# Patient Record
Sex: Male | Born: 1980 | Race: White | Hispanic: No | Marital: Single | Smoking: Current every day smoker
Health system: Southern US, Community
[De-identification: ages and names within clinical notes are randomized; demographics above are authoritative.]

## PROBLEM LIST (undated history)

## (undated) HISTORY — PX: HERNIA REPAIR: SHX51

---

## 2010-03-21 ENCOUNTER — Emergency Department: Payer: Self-pay | Admitting: Emergency Medicine

## 2011-07-01 ENCOUNTER — Emergency Department: Payer: Self-pay | Admitting: Unknown Physician Specialty

## 2011-12-18 ENCOUNTER — Emergency Department: Payer: Self-pay | Admitting: Emergency Medicine

## 2011-12-18 LAB — CBC
HCT: 52.2 % — ABNORMAL HIGH (ref 40.0–52.0)
MCHC: 34.1 g/dL (ref 32.0–36.0)
MCV: 89 fL (ref 80–100)
Platelet: 223 10*3/uL (ref 150–440)
RBC: 5.89 10*6/uL (ref 4.40–5.90)
RDW: 13.3 % (ref 11.5–14.5)

## 2011-12-18 LAB — URINALYSIS, COMPLETE
Bilirubin,UR: NEGATIVE
Blood: NEGATIVE
Glucose,UR: NEGATIVE mg/dL (ref 0–75)
Leukocyte Esterase: NEGATIVE
Ph: 7 (ref 4.5–8.0)
Protein: NEGATIVE
RBC,UR: NONE SEEN /HPF (ref 0–5)
Specific Gravity: 1.003 (ref 1.003–1.030)

## 2011-12-18 LAB — COMPREHENSIVE METABOLIC PANEL
Albumin: 4 g/dL (ref 3.4–5.0)
Anion Gap: 11 (ref 7–16)
BUN: 10 mg/dL (ref 7–18)
Calcium, Total: 8.8 mg/dL (ref 8.5–10.1)
Chloride: 109 mmol/L — ABNORMAL HIGH (ref 98–107)
Co2: 23 mmol/L (ref 21–32)
Creatinine: 0.85 mg/dL (ref 0.60–1.30)
Glucose: 85 mg/dL (ref 65–99)
Osmolality: 283 (ref 275–301)
SGOT(AST): 27 U/L (ref 15–37)
SGPT (ALT): 47 U/L
Total Protein: 7.2 g/dL (ref 6.4–8.2)

## 2014-10-30 ENCOUNTER — Emergency Department: Payer: Self-pay | Admitting: Emergency Medicine

## 2015-04-11 ENCOUNTER — Emergency Department
Admission: EM | Admit: 2015-04-11 | Discharge: 2015-04-11 | Disposition: A | Discharge status: Home / Self Care | Payer: Self-pay | Attending: Emergency Medicine | Admitting: Emergency Medicine

## 2015-04-11 DIAGNOSIS — L02415 Cutaneous abscess of right lower limb: Secondary | ICD-10-CM | POA: Insufficient documentation

## 2015-04-11 DIAGNOSIS — L03115 Cellulitis of right lower limb: Secondary | ICD-10-CM | POA: Insufficient documentation

## 2015-04-11 DIAGNOSIS — L03119 Cellulitis of unspecified part of limb: Secondary | ICD-10-CM

## 2015-04-11 DIAGNOSIS — Z720 Tobacco use: Secondary | ICD-10-CM | POA: Insufficient documentation

## 2015-04-11 DIAGNOSIS — L02419 Cutaneous abscess of limb, unspecified: Secondary | ICD-10-CM

## 2015-04-11 LAB — CBC WITH DIFFERENTIAL/PLATELET
Basophils Absolute: 0.1 10*3/uL (ref 0–0.1)
Basophils Relative: 0 %
EOS ABS: 0.1 10*3/uL (ref 0–0.7)
Eosinophils Relative: 1 %
HCT: 50.6 % (ref 40.0–52.0)
Hemoglobin: 17.1 g/dL (ref 13.0–18.0)
Lymphocytes Relative: 13 %
Lymphs Abs: 1.8 10*3/uL (ref 1.0–3.6)
MCH: 29.5 pg (ref 26.0–34.0)
MCHC: 33.8 g/dL (ref 32.0–36.0)
MCV: 87.3 fL (ref 80.0–100.0)
MONO ABS: 0.9 10*3/uL (ref 0.2–1.0)
Monocytes Relative: 7 %
Neutro Abs: 10.6 10*3/uL — ABNORMAL HIGH (ref 1.4–6.5)
Neutrophils Relative %: 79 %
Platelets: 238 10*3/uL (ref 150–440)
RBC: 5.8 MIL/uL (ref 4.40–5.90)
RDW: 13.8 % (ref 11.5–14.5)
WBC: 13.4 10*3/uL — AB (ref 3.8–10.6)

## 2015-04-11 LAB — BASIC METABOLIC PANEL
Anion gap: 6 (ref 5–15)
BUN: 9 mg/dL (ref 6–20)
CO2: 20 mmol/L — ABNORMAL LOW (ref 22–32)
CREATININE: 0.63 mg/dL (ref 0.61–1.24)
Calcium: 6.8 mg/dL — ABNORMAL LOW (ref 8.9–10.3)
Chloride: 116 mmol/L — ABNORMAL HIGH (ref 101–111)
GFR calc non Af Amer: >60 mL/min (ref >60)
Glucose, Bld: 75 mg/dL (ref 65–99)
POTASSIUM: 2.7 mmol/L — AB (ref 3.5–5.1)
SODIUM: 142 mmol/L (ref 135–145)

## 2015-04-11 MED ORDER — CLINDAMYCIN PHOSPHATE 600 MG/50ML IV SOLN
600.0000 mg | Freq: Once | INTRAVENOUS | Status: AC
  Administered 2015-04-11: 600 mg via INTRAVENOUS
  Filled 2015-04-11: qty 50

## 2015-04-11 MED ORDER — PREDNISONE 20 MG PO TABS: 60.0000 mg | ORAL_TABLET | Freq: Every day | ORAL | Status: DC

## 2015-04-11 MED ORDER — TRAMADOL HCL 50 MG PO TABS: 50.0000 mg | ORAL_TABLET | Freq: Four times a day (QID) | ORAL | Status: DC | PRN

## 2015-04-11 MED ORDER — CLINDAMYCIN HCL 150 MG PO CAPS: 150.0000 mg | ORAL_CAPSULE | Freq: Four times a day (QID) | ORAL | Status: AC

## 2015-04-11 MED ORDER — METHYLPREDNISOLONE SODIUM SUCC 125 MG IJ SOLR
125.0000 mg | Freq: Once | INTRAMUSCULAR | Status: AC
  Administered 2015-04-11: 125 mg via INTRAVENOUS
  Filled 2015-04-11: qty 2

## 2015-04-11 MED ORDER — HYDROXYZINE HCL 50 MG PO TABS: 50.0000 mg | ORAL_TABLET | Freq: Three times a day (TID) | ORAL | Status: AC | PRN

## 2015-04-11 NOTE — ED Provider Notes (Addendum)
[facility] Emergency Department Provider Note  ____________________________________________  Time seen: Approximately 3:47 PM  I have reviewed the triage vital signs and the nursing notes.   HISTORY  Chief Complaint Rash    HPI Adam Webb is a 34 y.o. male complaining of erythema edema to the right lower leg he states associated with burning of painful sensation. She state the left leg is not jaundiced same symptoms as severe as the right. Patient denies any fever with this complaint. Patient stated this no history of contact dermatitis. Patient stated onset and progressive the last 2 days. Patient rated his pain as a 4/10. Patient stated no palliative measures taken for this complaint.   History reviewed. No pertinent past medical history.  There are no active problems to display for this patient.   History reviewed. No pertinent past surgical history.  Current Outpatient Rx  Name  Route  Sig  Dispense  Refill  . clindamycin (CLEOCIN) 150 MG capsule   Oral   Take 1 capsule (150 mg total) by mouth 4 (four) times daily.   40 capsule   0   . hydrOXYzine (ATARAX/VISTARIL) 50 MG tablet   Oral   Take 1 tablet (50 mg total) by mouth 3 (three) times daily as needed.   21 tablet   0   . traMADol (ULTRAM) 50 MG tablet   Oral   Take 1 tablet (50 mg total) by mouth every 6 (six) hours as needed for moderate pain.   12 tablet   0     Allergies Ibuprofen  No family history on file.  Social History History  Substance Use Topics  . Smoking status: Current Every Day Smoker  . Smokeless tobacco: Not on file  . Alcohol Use: No    Review of Systems Constitutional: No fever/chills Eyes: No visual changes. ENT: No sore throat. Cardiovascular: Denies chest pain. Respiratory: Denies shortness of breath. Gastrointestinal: No abdominal pain.  No nausea, no vomiting.  No diarrhea.  No constipation. Genitourinary: Negative for  dysuria. Musculoskeletal: Negative for back pain. Skin: Negative for rash. Neurological: Negative for headaches, focal weakness or numbness. Endocrine: Hematological/Lymphatic: Allergic/Immunilogical: **} 10-point ROS otherwise negative.  ____________________________________________   PHYSICAL EXAM:  VITAL SIGNS: ED Triage Vitals  Enc Vitals Group     BP 04/11/15 1527 134/98 mmHg     Pulse Rate 04/11/15 1527 96     Resp 04/11/15 1527 18     Temp 04/11/15 1527 98.1 F (36.7 C)     Temp Source 04/11/15 1527 Oral     SpO2 04/11/15 1527 97 %     Weight 04/11/15 1527 270 lb (122.471 kg)     Height 04/11/15 1527 5\' 8"  (1.727 m)     Head Cir --      Peak Flow --      Pain Score 04/11/15 1524 4     Pain Loc --      Pain Edu? --      Excl. in GC? --     Constitutional: Alert and oriented. Well appearing and in no acute distress. Eyes: Conjunctivae are normal. PERRL. EOMI. Head: Atraumatic. Nose: No congestion/rhinnorhea. Mouth/Throat: Mucous membranes are moist.  Oropharynx non-erythematous. Neck: No stridor.  No cervical spine tenderness to palpation. Hematological/Lymphatic/Immunilogical: No cervical lymphadenopathy. Cardiovascular: Normal rate, regular rhythm. Grossly normal heart sounds.  Good peripheral circulation. Respiratory: Normal respiratory effort.  No retractions. Lungs CTAB. Gastrointestinal: Soft and nontender. No distention. No abdominal bruits. No CVA tenderness. Musculoskeletal:  lower extremity tenderness and edema. Calf circumference of the right leg was 43-1/2 cm versus 42 cm of the left leg calf circumference. Neurologic:  Normal speech and language. No gross focal neurologic deficits are appreciated. No gait instability. Skin: Right lower extremity is very erythematous and edematous. There is pitting but no blanching. Psychiatric: Mood and affect are normal. Speech and behavior are normal.  ____________________________________________   LABS (all  labs ordered are listed, but only abnormal results are displayed)  Labs Reviewed  CBC WITH DIFFERENTIAL/PLATELET - Abnormal; Notable for the following:    WBC 13.4 (*)    Neutro Abs 10.6 (*)    All other components within normal limits  BASIC METABOLIC PANEL - Abnormal; Notable for the following:    Potassium 2.7 (*)    Chloride 116 (*)    CO2 20 (*)    Calcium 6.8 (*)    All other components within normal limits   ____________________________________________  EKG  ____________________________________________  RADIOLOGY   ____________________________________________   PROCEDURES  Procedure(s) performed: None  Critical Care performed: No  ____________________________________________   INITIAL IMPRESSION / ASSESSMENT AND PLAN / ED COURSE  Pertinent labs & imaging results that were available during my care of the patient were reviewed by me and considered in my medical decision making (see chart for details).  Cellulitis lower extremities.. Patient given Solu-Medrol 25 mg IV, Cleocin 600 mg IV and discharged oral Cleocin. Patient will prescribed potassium 10 mEq daily for 10 days.. Patient of [facility] days.  Area was outlined with skin marking patient advised return back to the ER if redness exceeds the skin marking. Patient given a work excuse for 5 days. ____________________________________________   FINAL CLINICAL IMPRESSION(S) / ED DIAGNOSES  Final diagnoses:  Cellulitis and abscess of leg, except foot      Joni Reining, PA-C 04/11/15 1724  Joni Reining, PA-C 04/11/15 1728  Joni Reining, PA-C 04/11/15 1735  Phineas Semen, MD 04/11/15 1846  Joni Reining, PA-C 04/11/15 1912  Joni Reining, PA-C 04/11/15 1912  Phineas Semen, MD 04/11/15 2114

## 2015-04-11 NOTE — ED Notes (Signed)
Rash to lower right leg, painful, burning. Pt alert and oriented X4, active, cooperative, pt in NAD. RR even and unlabored, color WNL.

## 2015-04-11 NOTE — ED Notes (Signed)
AAOx3.  Skin warm and dry.  NAD 

## 2015-09-29 ENCOUNTER — Emergency Department
Admission: EM | Admit: 2015-09-29 | Discharge: 2015-09-29 | Disposition: A | Discharge status: Home / Self Care | Payer: Self-pay | Attending: Emergency Medicine | Admitting: Emergency Medicine

## 2015-09-29 ENCOUNTER — Encounter: Payer: Self-pay | Admitting: Emergency Medicine

## 2015-09-29 ENCOUNTER — Emergency Department: Payer: Self-pay

## 2015-09-29 DIAGNOSIS — J209 Acute bronchitis, unspecified: Secondary | ICD-10-CM | POA: Insufficient documentation

## 2015-09-29 DIAGNOSIS — Z792 Long term (current) use of antibiotics: Secondary | ICD-10-CM | POA: Insufficient documentation

## 2015-09-29 DIAGNOSIS — Z79899 Other long term (current) drug therapy: Secondary | ICD-10-CM | POA: Insufficient documentation

## 2015-09-29 DIAGNOSIS — F17200 Nicotine dependence, unspecified, uncomplicated: Secondary | ICD-10-CM | POA: Insufficient documentation

## 2015-09-29 MED ORDER — PREDNISONE 10 MG PO TABS: ORAL_TABLET | ORAL | Status: AC

## 2015-09-29 MED ORDER — PREDNISONE 20 MG PO TABS
60.0000 mg | ORAL_TABLET | Freq: Once | ORAL | Status: AC
  Administered 2015-09-29: 60 mg via ORAL
  Filled 2015-09-29: qty 3

## 2015-09-29 MED ORDER — IPRATROPIUM-ALBUTEROL 0.5-2.5 (3) MG/3ML IN SOLN
3.0000 mL | Freq: Once | RESPIRATORY_TRACT | Status: AC
  Administered 2015-09-29: 3 mL via RESPIRATORY_TRACT
  Filled 2015-09-29: qty 3

## 2015-09-29 MED ORDER — ALBUTEROL SULFATE HFA 108 (90 BASE) MCG/ACT IN AERS: 2.0000 | INHALATION_SPRAY | Freq: Four times a day (QID) | RESPIRATORY_TRACT | Status: AC | PRN

## 2015-09-29 MED ORDER — BENZONATATE 100 MG PO CAPS: 200.0000 mg | ORAL_CAPSULE | Freq: Three times a day (TID) | ORAL | Status: AC | PRN

## 2015-09-29 NOTE — ED Provider Notes (Signed)
[facility] Emergency Department Provider Note ____________________________________________  Time seen: Approximately 3:32 PM  I have reviewed the triage vital signs and the nursing notes.   HISTORY  Chief Complaint Cough  HPI Adam Webb is a 35 y.o. male is here with cough and congestion for several days. Patient states that it is nonproductive cough but that when he gets short of breath during one of his "coughing spells" he also hears a no wheeze that he is unfamiliar with. Patient states that he did have asthma as a child but has not been on any medication since that time. Patient smokes tach half packs of cigarettes per day. He has been using Mucinex and some daytime cough medication along with some cough drops. He is unaware of any fever but felt chills this morning. He states that the coughing is worse when he is is trying to talk.He rates his discomfort as 6 out of 10.   History reviewed. No pertinent past medical history.  There are no active problems to display for this patient.   Past Surgical History  Procedure Laterality Date  . Hernia repair      Current Outpatient Rx  Name  Route  Sig  Dispense  Refill  . albuterol (PROVENTIL HFA;VENTOLIN HFA) 108 (90 Base) MCG/ACT inhaler   Inhalation   Inhale 2 puffs into the lungs every 6 (six) hours as needed for wheezing or shortness of breath.   1 Inhaler   2   . benzonatate (TESSALON PERLES) 100 MG capsule   Oral   Take 2 capsules (200 mg total) by mouth 3 (three) times daily as needed for cough.   30 capsule   0   . clindamycin (CLEOCIN) 150 MG capsule   Oral   Take 1 capsule (150 mg total) by mouth 4 (four) times daily.   40 capsule   0   . hydrOXYzine (ATARAX/VISTARIL) 50 MG tablet   Oral   Take 1 tablet (50 mg total) by mouth 3 (three) times daily as needed.   21 tablet   0   . predniSONE (DELTASONE) 10 MG tablet      day 2 take 5 tablets, day 3 take 4 tablets, day 4 take 3  tablets, day 5 take  2 tablets and 1 tablet the last day   15 tablet   0   . traMADol (ULTRAM) 50 MG tablet   Oral   Take 1 tablet (50 mg total) by mouth every 6 (six) hours as needed for moderate pain.   12 tablet   0     Allergies Ibuprofen  No family history on file.  Social History Social History  Substance Use Topics  . Smoking status: Current Every Day Smoker  . Smokeless tobacco: None  . Alcohol Use: No    Review of Systems Constitutional: No fever/chills ENT: Positive sore throat secondary to coughing. Cardiovascular: Denies chest pain. Respiratory: Denies shortness of breath. Gastrointestinal: No abdominal pain.  No nausea, no vomiting.  Genitourinary: Negative for dysuria. Musculoskeletal: Negative for back pain. Skin: Negative for rash. Neurological: Positive for headaches with coughing, no focal weakness or numbness.  10-point ROS otherwise negative.  ____________________________________________   PHYSICAL EXAM:  VITAL SIGNS: ED Triage Vitals  Enc Vitals Group     BP 09/29/15 1457 138/72 mmHg     Pulse Rate 09/29/15 1457 94     Resp 09/29/15 1457 20     Temp 09/29/15 1457 98.2 F (36.8 C)  Temp Source 09/29/15 1457 Oral     SpO2 09/29/15 1457 95 %     Weight 09/29/15 1457 240 lb (108.863 kg)     Height 09/29/15 1457 5\' 8"  (1.727 m)     Head Cir --      Peak Flow --      Pain Score 09/29/15 1458 6     Pain Loc --      Pain Edu? --      Excl. in GC? --     Constitutional: Alert and oriented. Well appearing and in no acute distress. Eyes: Conjunctivae are normal. PERRL. EOMI. Head: Atraumatic. Nose: Mild to moderate congestion/no rhinnorhea.   EACs and TMs are clear bilaterally. Mouth/Throat: Mucous membranes are moist.  Oropharynx non-erythematous. Positive posterior drainage. Neck: No stridor.  Supple Hematological/Lymphatic/Immunilogical: No cervical lymphadenopathy. Cardiovascular: Normal rate, regular rhythm. Grossly normal heart  sounds.  Good peripheral circulation. Respiratory: Normal respiratory effort.  No retractions. Lungs bilateral expiratory wheezes noted throughout. Patient has a dry cough when trying to speak which is spasmodic in nature. Gastrointestinal: Soft and nontender. No distention. Musculoskeletal: No lower extremity tenderness nor edema.  No joint effusions. Neurologic:  Normal speech and language. No gross focal neurologic deficits are appreciated. No gait instability. Skin:  Skin is warm, dry and intact. No rash noted. Psychiatric: Mood and affect are normal. Speech and behavior are normal.  ____________________________________________   LABS (all labs ordered are listed, but only abnormal results are displayed)  Labs Reviewed - No data to display   RADIOLOGY  X-rays x-ray per radiologist shows no active cardiopulmonary disease. ____________________________________________   PROCEDURES  Procedure(s) performed: None  Critical Care performed: No  ____________________________________________   INITIAL IMPRESSION / ASSESSMENT AND PLAN / ED COURSE  Pertinent labs & imaging results that were available during my care of the patient were reviewed by me and considered in my medical decision making (see chart for details).  She was given a prescription for albuterol inhaler along with Tessalon Perles as needed for cough and prednisone 60 mg 6 day taper. He is to follow-up with his primary doctor or [facility] if any continued problems. ____________________________________________   FINAL CLINICAL IMPRESSION(S) / ED DIAGNOSES  Final diagnoses:  Acute bronchitis, unspecified organism      Tommi Rumps, PA-C 10/02/15 1405  Jennye Moccasin, MD 10/17/15 [phone]

## 2015-09-29 NOTE — ED Notes (Signed)
Cough congestion , sinus congestion x2 days  , worsening symptoms today with difficulty breathing

## 2015-09-29 NOTE — ED Notes (Addendum)
Pt states he started having congestion and cough sxs a few days ago. Does have coughing spells and cough is non-productive.  Pt states he gets short of breath and wheezes are audible.  Pt states he smokes 1/2 per day and has not smoked today. Pt has taken cough drops and mucinex and some daytime cough medication as well.

## 2015-09-29 NOTE — Discharge Instructions (Signed)
Acute Bronchitis Bronchitis is when the airways that extend from the windpipe into the lungs get red, puffy, and painful (inflamed). Bronchitis often causes thick spit (mucus) to develop. This leads to a cough. A cough is the most common symptom of bronchitis. In acute bronchitis, the condition usually begins suddenly and goes away over time (usually in 2 weeks). Smoking, allergies, and asthma can make bronchitis worse. Repeated episodes of bronchitis may cause more lung problems. HOME CARE  Rest.  Drink enough fluids to keep your pee (urine) clear or pale yellow (unless you need to limit fluids as told by your doctor).  Only take over-the-counter or prescription medicines as told by your doctor.  Avoid smoking and secondhand smoke. These can make bronchitis worse. If you are a smoker, think about using nicotine gum or skin patches. Quitting smoking will help your lungs heal faster.  Reduce the chance of getting bronchitis again by:  Washing your hands often.  Avoiding people with cold symptoms.  Trying not to touch your hands to your mouth, nose, or eyes.  Follow up with your doctor as told. GET HELP IF: Your symptoms do not improve after 1 week of treatment. Symptoms include:  Cough.  Fever.  Coughing up thick spit.  Body aches.  Chest congestion.  Chills.  Shortness of breath.  Sore throat. GET HELP RIGHT AWAY IF:   You have an increased fever.  You have chills.  You have severe shortness of breath.  You have bloody thick spit (sputum).  You throw up (vomit) often.  You lose too much body fluid (dehydration).  You have a severe headache.  You faint. MAKE SURE YOU:   Understand these instructions.  Will watch your condition.  Will get help right away if you are not doing well or get worse.   This information is not intended to replace advice given to you by your health care provider. Make sure you discuss any questions you have with your health care  provider.   Document Released: 02/17/2008 Document Revised: 05/03/2013 Document Reviewed: 02/21/2013 Elsevier Interactive Patient Education [employer]2016 [employer]    In taking prednisone tomorrow as directed. Albuterol inhaler as needed.   Stop smoking. Tessalon Perles 1 or 2 every 8 hours as needed for coughing. [facility] if any continued problems.

## 2016-08-13 ENCOUNTER — Emergency Department
Admission: EM | Admit: 2016-08-13 | Discharge: 2016-08-13 | Disposition: A | Discharge status: Home / Self Care | Payer: No Typology Code available for payment source | Attending: Student in an Organized Health Care Education/Training Program | Admitting: Student in an Organized Health Care Education/Training Program

## 2016-08-13 ENCOUNTER — Emergency Department: Payer: No Typology Code available for payment source

## 2016-08-13 ENCOUNTER — Encounter: Payer: Self-pay | Admitting: Emergency Medicine

## 2016-08-13 DIAGNOSIS — Y92410 Unspecified street and highway as the place of occurrence of the external cause: Secondary | ICD-10-CM | POA: Insufficient documentation

## 2016-08-13 DIAGNOSIS — F17200 Nicotine dependence, unspecified, uncomplicated: Secondary | ICD-10-CM | POA: Diagnosis not present

## 2016-08-13 DIAGNOSIS — S161XXA Strain of muscle, fascia and tendon at neck level, initial encounter: Secondary | ICD-10-CM | POA: Insufficient documentation

## 2016-08-13 DIAGNOSIS — M62838 Other muscle spasm: Secondary | ICD-10-CM

## 2016-08-13 DIAGNOSIS — S199XXA Unspecified injury of neck, initial encounter: Secondary | ICD-10-CM | POA: Diagnosis present

## 2016-08-13 DIAGNOSIS — Y999 Unspecified external cause status: Secondary | ICD-10-CM | POA: Diagnosis not present

## 2016-08-13 DIAGNOSIS — Y9389 Activity, other specified: Secondary | ICD-10-CM | POA: Diagnosis not present

## 2016-08-13 MED ORDER — TRAMADOL HCL 50 MG PO TABS: 50.0000 mg | ORAL_TABLET | Freq: Four times a day (QID) | ORAL | 0 refills | Status: AC | PRN

## 2016-08-13 MED ORDER — ACETAMINOPHEN 500 MG PO TABS
1000.0000 mg | ORAL_TABLET | Freq: Once | ORAL | Status: AC
  Administered 2016-08-13: 1000 mg via ORAL
  Filled 2016-08-13: qty 2

## 2016-08-13 MED ORDER — CYCLOBENZAPRINE HCL 10 MG PO TABS: 10.0000 mg | ORAL_TABLET | Freq: Three times a day (TID) | ORAL | 0 refills | Status: AC | PRN
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
## 2017-01-29 ENCOUNTER — Other Ambulatory Visit: Payer: Self-pay | Admitting: Orthopedic Surgery

## 2017-01-29 DIAGNOSIS — M545 Low back pain, unspecified: Secondary | ICD-10-CM

## 2017-01-29 DIAGNOSIS — M5440 Lumbago with sciatica, unspecified side: Secondary | ICD-10-CM

## 2017-01-29 DIAGNOSIS — S39012D Strain of muscle, fascia and tendon of lower back, subsequent encounter: Secondary | ICD-10-CM

## 2017-02-10 ENCOUNTER — Ambulatory Visit
Admission: RE | Admit: 2017-02-10 | Discharge: 2017-02-10 | Disposition: A | Discharge status: Home / Self Care | Payer: Self-pay | Source: Ambulatory Visit | Attending: Orthopedic Surgery | Admitting: Orthopedic Surgery

## 2017-02-10 DIAGNOSIS — S39012D Strain of muscle, fascia and tendon of lower back, subsequent encounter: Secondary | ICD-10-CM

## 2017-02-10 DIAGNOSIS — M545 Low back pain, unspecified: Secondary | ICD-10-CM

## 2017-02-10 DIAGNOSIS — M5440 Lumbago with sciatica, unspecified side: Secondary | ICD-10-CM

## 2020-03-21 ENCOUNTER — Ambulatory Visit
Admission: EM | Admit: 2020-03-21 | Discharge: 2020-03-21 | Disposition: A | Discharge status: Home / Self Care | Payer: Self-pay | Attending: Student in an Organized Health Care Education/Training Program | Admitting: Student in an Organized Health Care Education/Training Program

## 2020-03-21 ENCOUNTER — Ambulatory Visit: Admit: 2020-03-21 | Payer: Self-pay | Admitting: Urology

## 2020-03-21 ENCOUNTER — Encounter
Admission: EM | Disposition: A | Discharge status: Home / Self Care | Payer: Self-pay | Source: Home / Self Care | Attending: Student in an Organized Health Care Education/Training Program

## 2020-03-21 ENCOUNTER — Other Ambulatory Visit: Payer: Self-pay

## 2020-03-21 ENCOUNTER — Emergency Department: Payer: Self-pay

## 2020-03-21 ENCOUNTER — Other Ambulatory Visit: Payer: Self-pay | Admitting: Radiology

## 2020-03-21 ENCOUNTER — Encounter: Payer: Self-pay | Admitting: *Deleted

## 2020-03-21 DIAGNOSIS — Z886 Allergy status to analgesic agent status: Secondary | ICD-10-CM | POA: Insufficient documentation

## 2020-03-21 DIAGNOSIS — N201 Calculus of ureter: Secondary | ICD-10-CM

## 2020-03-21 DIAGNOSIS — N132 Hydronephrosis with renal and ureteral calculous obstruction: Secondary | ICD-10-CM | POA: Insufficient documentation

## 2020-03-21 DIAGNOSIS — R109 Unspecified abdominal pain: Secondary | ICD-10-CM

## 2020-03-21 DIAGNOSIS — Z20822 Contact with and (suspected) exposure to covid-19: Secondary | ICD-10-CM | POA: Insufficient documentation

## 2020-03-21 DIAGNOSIS — Z888 Allergy status to other drugs, medicaments and biological substances status: Secondary | ICD-10-CM | POA: Insufficient documentation

## 2020-03-21 DIAGNOSIS — F17200 Nicotine dependence, unspecified, uncomplicated: Secondary | ICD-10-CM | POA: Insufficient documentation

## 2020-03-21 HISTORY — PX: EXTRACORPOREAL SHOCK WAVE LITHOTRIPSY: SHX1557

## 2020-03-21 LAB — CBC
HCT: 48.2 % (ref 39.0–52.0)
Hemoglobin: 16.7 g/dL (ref 13.0–17.0)
MCH: 28.9 pg (ref 26.0–34.0)
MCHC: 34.6 g/dL (ref 30.0–36.0)
MCV: 83.4 fL (ref 80.0–100.0)
Platelets: 350 10*3/uL (ref 150–400)
RBC: 5.78 MIL/uL (ref 4.22–5.81)
RDW: 13.2 % (ref 11.5–15.5)
WBC: 8.2 10*3/uL (ref 4.0–10.5)
nRBC: 0 % (ref 0.0–0.2)

## 2020-03-21 LAB — URINALYSIS, COMPLETE (UACMP) WITH MICROSCOPIC
Bacteria, UA: NONE SEEN
Bilirubin Urine: NEGATIVE
Glucose, UA: NEGATIVE mg/dL
Ketones, ur: NEGATIVE mg/dL
Leukocytes,Ua: NEGATIVE
Nitrite: NEGATIVE
Protein, ur: 30 mg/dL — AB
RBC / HPF: >50 RBC/hpf — ABNORMAL HIGH (ref 0–5)
Specific Gravity, Urine: 1.016 (ref 1.005–1.030)
pH: 6 (ref 5.0–8.0)

## 2020-03-21 LAB — COMPREHENSIVE METABOLIC PANEL
ALT: 35 U/L (ref 0–44)
AST: 20 U/L (ref 15–41)
Albumin: 4.3 g/dL (ref 3.5–5.0)
Alkaline Phosphatase: 92 U/L (ref 38–126)
Anion gap: 11 (ref 5–15)
BUN: 11 mg/dL (ref 6–20)
CO2: 23 mmol/L (ref 22–32)
Calcium: 9.3 mg/dL (ref 8.9–10.3)
Chloride: 103 mmol/L (ref 98–111)
Creatinine, Ser: 1.02 mg/dL (ref 0.61–1.24)
GFR calc Af Amer: >60 mL/min (ref >60)
GFR calc non Af Amer: >60 mL/min (ref >60)
Glucose, Bld: 107 mg/dL — ABNORMAL HIGH (ref 70–99)
Potassium: 3.8 mmol/L (ref 3.5–5.1)
Sodium: 137 mmol/L (ref 135–145)
Total Bilirubin: 0.6 mg/dL (ref 0.3–1.2)
Total Protein: 7.9 g/dL (ref 6.5–8.1)

## 2020-03-21 LAB — LIPASE, BLOOD: Lipase: 32 U/L (ref 11–51)

## 2020-03-21 LAB — SARS CORONAVIRUS 2 BY RT PCR (HOSPITAL ORDER, PERFORMED IN ~~LOC~~ HOSPITAL LAB): SARS Coronavirus 2: NEGATIVE

## 2020-03-21 SURGERY — LITHOTRIPSY, ESWL
Anesthesia: Moderate Sedation | Laterality: Left

## 2020-03-21 MED ORDER — HYDROCODONE-ACETAMINOPHEN 5-325 MG PO TABS: 1.0000 | ORAL_TABLET | Freq: Once | ORAL | Status: AC

## 2020-03-21 MED ORDER — ONDANSETRON HCL 4 MG/2ML IJ SOLN
4.0000 mg | Freq: Once | INTRAMUSCULAR | Status: AC | PRN
  Administered 2020-03-21: 4 mg via INTRAVENOUS
  Filled 2020-03-21: qty 2

## 2020-03-21 MED ORDER — HYDROCODONE-ACETAMINOPHEN 5-325 MG PO TABS
ORAL_TABLET | ORAL | Status: AC
  Administered 2020-03-21: 1 via ORAL
  Filled 2020-03-21: qty 1

## 2020-03-21 MED ORDER — SODIUM CHLORIDE 0.9 % IV SOLN: INTRAVENOUS | Status: DC

## 2020-03-21 MED ORDER — DIPHENHYDRAMINE HCL 25 MG PO CAPS: 25.0000 mg | ORAL_CAPSULE | ORAL | Status: AC

## 2020-03-21 MED ORDER — HYDROMORPHONE HCL 1 MG/ML IJ SOLN
0.5000 mg | INTRAMUSCULAR | Status: DC | PRN
  Administered 2020-03-21 (×3): 0.5 mg via INTRAVENOUS
  Filled 2020-03-21 (×3): qty 1

## 2020-03-21 MED ORDER — CIPROFLOXACIN HCL 500 MG PO TABS: 500.0000 mg | ORAL_TABLET | ORAL | Status: AC

## 2020-03-21 MED ORDER — FENTANYL CITRATE (PF) 100 MCG/2ML IJ SOLN
50.0000 ug | INTRAMUSCULAR | Status: AC | PRN
  Administered 2020-03-21 (×2): 50 ug via INTRAVENOUS
  Filled 2020-03-21 (×2): qty 2

## 2020-03-21 MED ORDER — DIAZEPAM 5 MG PO TABS
ORAL_TABLET | ORAL | Status: AC
  Administered 2020-03-21: 10 mg via ORAL
  Filled 2020-03-21: qty 2

## 2020-03-21 MED ORDER — ONDANSETRON HCL 4 MG/2ML IJ SOLN
INTRAMUSCULAR | Status: AC
  Administered 2020-03-21: 4 mg via INTRAVENOUS
  Filled 2020-03-21: qty 2

## 2020-03-21 MED ORDER — HYDROCODONE-ACETAMINOPHEN 5-325 MG PO TABS: 1.0000 | ORAL_TABLET | ORAL | 0 refills | Status: AC | PRN

## 2020-03-21 MED ORDER — CIPROFLOXACIN HCL 500 MG PO TABS
ORAL_TABLET | ORAL | Status: AC
  Administered 2020-03-21: 500 mg via ORAL
  Filled 2020-03-21: qty 1

## 2020-03-21 MED ORDER — TAMSULOSIN HCL 0.4 MG PO CAPS
0.4000 mg | ORAL_CAPSULE | Freq: Every day | ORAL | 0 refills | Status: DC
Start: 2020-03-21 — End: 2020-03-26

## 2020-03-21 MED ORDER — DIPHENHYDRAMINE HCL 25 MG PO CAPS
ORAL_CAPSULE | ORAL | Status: AC
  Administered 2020-03-21: 25 mg via ORAL
  Filled 2020-03-21: qty 1

## 2020-03-21 MED ORDER — DIAZEPAM 5 MG PO TABS: 10.0000 mg | ORAL_TABLET | ORAL | Status: AC

## 2020-03-21 MED ORDER — ONDANSETRON HCL 4 MG/2ML IJ SOLN: 4.0000 mg | Freq: Once | INTRAMUSCULAR | Status: AC | PRN

## 2020-03-21 NOTE — ED Notes (Signed)
Pt reporting continued pain. RN to ask for MD order.

## 2020-03-21 NOTE — Progress Notes (Signed)
Pt resting, comfortable in no acute distress. Remote given and patient instructed on call light . Report given to Preop RN Servando Snare.

## 2020-03-21 NOTE — ED Triage Notes (Signed)
Pt c/o left flank pain with sudden onset while urinating this AM.

## 2020-03-21 NOTE — ED Provider Notes (Addendum)
[facility] Emergency Department Provider Note    None    (approximate)  I have reviewed the triage vital signs and the nursing notes.   HISTORY  Chief Complaint Flank Pain    HPI Adam Webb is a 39 y.o. male presents to the ER for evaluation of acute onset left flank pain that woke him from sleep.  Describes the pain as stabbing and severe in nature.  Is never had pain like this before.  Blood thinners.  Denies any recent NSAID use.  Denies any fevers.  States that pain started when he need to use the restroom but denies any other dysuria.    History reviewed. No pertinent past medical history. History reviewed. No pertinent family history. Past Surgical History:  Procedure Laterality Date  . HERNIA REPAIR     There are no problems to display for this patient.     Prior to Admission medications   Medication Sig Start Date End Date Taking? Authorizing Provider  acetaminophen (TYLENOL) 325 MG tablet Take 650 mg by mouth every 6 (six) hours as needed for mild pain.   Yes [provider]  albuterol (PROVENTIL HFA;VENTOLIN HFA) 108 (90 Base) MCG/ACT inhaler Inhale 2 puffs into the lungs every 6 (six) hours as needed for wheezing or shortness of breath. Patient not taking: Reported on 03/21/2020 09/29/15   Tommi Rumps, PA-C  clindamycin (CLEOCIN) 150 MG capsule Take 1 capsule (150 mg total) by mouth 4 (four) times daily. Patient not taking: Reported on 03/21/2020 04/11/15   Joni Reining, PA-C  cyclobenzaprine (FLEXERIL) 10 MG tablet Take 1 tablet (10 mg total) by mouth 3 (three) times daily as needed for muscle spasms. Patient not taking: Reported on 03/21/2020 08/13/16   Hagler, Jami L, PA-C  hydrOXYzine (ATARAX/VISTARIL) 50 MG tablet Take 1 tablet (50 mg total) by mouth 3 (three) times daily as needed. 04/11/15   Joni Reining, PA-C  predniSONE (DELTASONE) 10 MG tablet day 2 take 5 tablets, day 3 take 4 tablets, day 4 take 3 tablets, day 5  take  2 tablets and 1 tablet the last day Patient not taking: Reported on 03/21/2020 09/29/15   Tommi Rumps, PA-C  traMADol (ULTRAM) 50 MG tablet Take 1 tablet (50 mg total) by mouth every 6 (six) hours as needed. Patient not taking: Reported on 03/21/2020 08/13/16   Hagler, Jami L, PA-C    Allergies Aspirin, Ibuprofen, and Meloxicam    Social History Social History   Tobacco Use  . Smoking status: Current Every Day Smoker  . Smokeless tobacco: Never Used  Substance Use Topics  . Alcohol use: No  . Drug use: Not on file    Review of Systems Patient denies headaches, rhinorrhea, blurry vision, numbness, shortness of breath, chest pain, edema, cough, abdominal pain, nausea, vomiting, diarrhea, dysuria, fevers, rashes or hallucinations unless otherwise stated above in HPI. ____________________________________________   PHYSICAL EXAM:  VITAL SIGNS: Vitals:   03/21/20 0823 03/21/20 1348  BP: 122/72 114/74  Pulse: 80 79  Resp: 16 17  Temp:    SpO2: 97% 99%    Constitutional: Alert and oriented.  Eyes: Conjunctivae are normal.  Head: Atraumatic. Nose: No congestion/rhinnorhea. Mouth/Throat: Mucous membranes are moist.   Neck: No stridor. Painless ROM.  Cardiovascular: Normal rate, regular rhythm. Grossly normal heart sounds.  Good peripheral circulation. Respiratory: Normal respiratory effort.  No retractions. Lungs CTAB. Gastrointestinal: Soft and nontender. No distention. No abdominal bruits. No CVA tenderness. Genitourinary:  Musculoskeletal: No lower extremity tenderness nor edema.  No joint effusions. Neurologic:  Normal speech and language. No gross focal neurologic deficits are appreciated. No facial droop Skin:  Skin is warm, dry and intact. No rash noted. Psychiatric: Mood and affect are normal. Speech and behavior are normal.  ____________________________________________   LABS (all labs ordered are listed, but only abnormal results are  displayed)  Results for orders placed or performed during the hospital encounter of 03/21/20 (from the past 24 hour(s))  Lipase, blood     Status: None   Collection Time: 03/21/20  1:43 AM  Result Value Ref Range   Lipase 32 11 - 51 U/L  Comprehensive metabolic panel     Status: Abnormal   Collection Time: 03/21/20  1:43 AM  Result Value Ref Range   Sodium 137 135 - 145 mmol/L   Potassium 3.8 3.5 - 5.1 mmol/L   Chloride 103 98 - 111 mmol/L   CO2 23 22 - 32 mmol/L   Glucose, Bld 107 (H) 70 - 99 mg/dL   BUN 11 6 - 20 mg/dL   Creatinine, Ser 2.26 0.61 - 1.24 mg/dL   Calcium 9.3 8.9 - 33.3 mg/dL   Total Protein 7.9 6.5 - 8.1 g/dL   Albumin 4.3 3.5 - 5.0 g/dL   AST 20 15 - 41 U/L   ALT 35 0 - 44 U/L   Alkaline Phosphatase 92 38 - 126 U/L   Total Bilirubin 0.6 0.3 - 1.2 mg/dL   GFR calc non Af Amer >60 >60 mL/min   GFR calc Af Amer >60 >60 mL/min   Anion gap 11 5 - 15  CBC     Status: None   Collection Time: 03/21/20  1:43 AM  Result Value Ref Range   WBC 8.2 4.0 - 10.5 K/uL   RBC 5.78 4.22 - 5.81 MIL/uL   Hemoglobin 16.7 13.0 - 17.0 g/dL   HCT 54.5 39 - 52 %   MCV 83.4 80.0 - 100.0 fL   MCH 28.9 26.0 - 34.0 pg   MCHC 34.6 30.0 - 36.0 g/dL   RDW 62.5 63.8 - 93.7 %   Platelets 350 150 - 400 K/uL   nRBC 0.0 0.0 - 0.2 %  Urinalysis, Complete w Microscopic     Status: Abnormal   Collection Time: 03/21/20  6:55 AM  Result Value Ref Range   Color, Urine YELLOW (A) YELLOW   APPearance CLOUDY (A) CLEAR   Specific Gravity, Urine 1.016 1.005 - 1.030   pH 6.0 5.0 - 8.0   Glucose, UA NEGATIVE NEGATIVE mg/dL   Hgb urine dipstick LARGE (A) NEGATIVE   Bilirubin Urine NEGATIVE NEGATIVE   Ketones, ur NEGATIVE NEGATIVE mg/dL   Protein, ur 30 (A) NEGATIVE mg/dL   Nitrite NEGATIVE NEGATIVE   Leukocytes,Ua NEGATIVE NEGATIVE   RBC / HPF >50 (H) 0 - 5 RBC/hpf   WBC, UA 21-50 0 - 5 WBC/hpf   Bacteria, UA NONE SEEN NONE SEEN   Squamous Epithelial / LPF 0-5 0 - 5   Mucus PRESENT   SARS  Coronavirus 2 by RT PCR (hospital order, performed in [facility] hospital lab) Nasopharyngeal Nasopharyngeal Swab     Status: None   Collection Time: 03/21/20  9:06 AM   Specimen: Nasopharyngeal Swab  Result Value Ref Range   SARS Coronavirus 2 NEGATIVE NEGATIVE   ____________________________________________ ____________________________________________  RADIOLOGY  I personally reviewed all radiographic images ordered to evaluate for the above acute complaints and reviewed radiology reports  and findings.  These findings were personally discussed with the patient.  Please see medical record for radiology report.  ____________________________________________   PROCEDURES  Procedure(s) performed:  Procedures    Critical Care performed: no ____________________________________________   INITIAL IMPRESSION / ASSESSMENT AND PLAN / ED COURSE  Pertinent labs & imaging results that were available during my care of the patient were reviewed by me and considered in my medical decision making (see chart for details).   DDX: sone, pyelo, AAA, colic, msk strain  Adam Webb is a 38 y.o. who presents to the ED with symptoms as described above.  Patient is uncomfortable appearing.  Work-up out of triage shows no evidence of infection but does have evidence of left hydro with a 7 x 3 mm proximal ureter stone.  Have consulted urology.  Have recommended KUB to evaluate if able to see stone as patient may be candidate for lithotripsy today.  Clinical Course as of Mar 21 1412  Thu Mar 21, 2020  0915 Patient evaluated by urology at bedside with plan for SWL today.  Will keep n.p.o.   [PR]    Clinical Course User Index [PR] Willy Eddy, MD    The patient was evaluated in Emergency Department today for the symptoms described in the history of present illness. He/she was evaluated in the context of the global COVID-19 pandemic, which necessitated consideration that the patient might be at  risk for infection with the SARS-CoV-2 virus that causes COVID-19. Institutional protocols and algorithms that pertain to the evaluation of patients at risk for COVID-19 are in a state of rapid change based on information released by regulatory bodies including the CDC and federal and state organizations. These policies and algorithms were followed during the patient's care in the ED.  As part of my medical decision making, I reviewed the following data within the electronic MEDICAL RECORD NUMBER Nursing notes reviewed and incorporated, Labs reviewed, notes from prior ED visits and [place] Controlled Substance Database   ____________________________________________   FINAL CLINICAL IMPRESSION(S) / ED DIAGNOSES  Final diagnoses:  Ureteral stone  Left flank pain      NEW MEDICATIONS STARTED DURING THIS VISIT:  Current Discharge Medication List       Note:  This document was prepared using Dragon voice recognition software and may include unintentional dictation errors.    Willy Eddy, MD 03/21/20 [id number]    Willy Eddy, MD 03/21/20 [phone]

## 2020-03-21 NOTE — Brief Op Note (Signed)
03/21/2020  7:03 PM  PATIENT:  Stefanie Libel  39 y.o. male  PRE-OPERATIVE DIAGNOSIS:  LEFT 37mm proximal ureteral stone  POST-OPERATIVE DIAGNOSIS: Same  PROCEDURE:  Procedure(s): EXTRACORPOREAL SHOCK WAVE LITHOTRIPSY (ESWL) (Left)  SURGEON:  Surgeon(s) and Role:    * Sondra Come, MD - Primary  ANESTHESIA: Conscious Sedation  EBL:  None  Drains: None  Specimen: None  Findings:  1. Challenging to position stone secondary to body habitus 2. Stone smudged at conclusion of case  DISPO: Flomax, pain meds PRN, RTC 2 weeks KUB  Legrand Rams, MD 03/21/2020

## 2020-03-21 NOTE — Discharge Instructions (Signed)

## 2020-03-21 NOTE — TOC Progression Note (Signed)
Transition of Care [facility]) - Progression Note    Patient Details  Name: Adam Webb MRN: [id number] Date of Birth: 1981-08-08  Transition of Care [facility]) CM/SW Contact  [place] Cellar, RN Phone Number: 03/21/2020, 12:35 PM  Clinical Narrative:    Confirmed with Amy, scheduled at [facility] for Litho today @ 430. RN CM updated EDP and ED RN of confirmation.. Requested ED RN update patient and friend at bedside. RN CM notified by ED RN that family is requesting transfer to [facility]. After confirming patient was going to [facility] today-friend was okay with no transfer.   Expected Discharge Plan: Home/Self Care Barriers to Discharge: Continued Medical Work up  Expected Discharge Plan and Services Expected Discharge Plan: Home/Self Care       Living arrangements for the past 2 months: Single Family Home                                       Social Determinants of Health (SDOH) Interventions    Readmission Risk Interventions No flowsheet data found.

## 2020-03-21 NOTE — Consult Note (Addendum)
   Urology Consult  I have been asked to see the patient by Dr. Robinson, for evaluation and management of renal colic secondary to a 7 x 3 mm proximal left ureteral stone.  Chief Complaint: Left flank pain, abdominal pain, nausea  History of Present Illness: Adam Webb is a 39 y.o. year old male who presented to the ED overnight with reports of sudden onset left flank pain.  CT stone study revealed a 7 x 3 mm obstructing proximal left ureteral stone associated with hydronephrosis and perinephric stranding, also with a punctate lower pole calculus.  UA notable for >50 RBCs/hpf, 21-50 WBCs/hpf, no nitrites, and no leukocyte esterase.  WBC count 8.2, creatinine 1.02.  Stone is visualizable on KUB.  Stone density approximately 800HU, skin to stone distance approximately 18cm.  Today, patient reports an approximate 3-week history of intermittent abdominal pain that he attributed to GI distress.  He states his symptoms acutely worsened overnight when he attempted to urinate.  He reports nausea without vomiting and states his urine was darker this morning than it had been.  No fever or chills.  He denies a history of nephrolithiasis.    Last food intake yesterday.  He had water and ice chips around 0700 this morning.  He denies recent NSAID use, reporting allergies to this class of medication.  No past medical history on file.  Past Surgical History:  Procedure Laterality Date  . HERNIA REPAIR      Home Medications:  Current Meds  Medication Sig  . acetaminophen (TYLENOL) 325 MG tablet Take 650 mg by mouth every 6 (six) hours as needed for mild pain.    Allergies:  Allergies  Allergen Reactions  . Aspirin Hives  . Ibuprofen Anaphylaxis  . Meloxicam Hives   No family history on file.  Social History:  reports that he has been smoking. He has never used smokeless tobacco. He reports that he does not drink alcohol. No history on file for drug use.  ROS: A complete review of  systems was performed.  All systems are negative except for pertinent findings as noted.  Physical Exam:  Vital signs in last 24 hours: Temp:  [98 F (36.7 C)] 98 F (36.7 C) (07/08 0520) Pulse Rate:  [80-92] 80 (07/08 0823) Resp:  [16] 16 (07/08 0823) BP: (121-125)/(72-89) 122/72 (07/08 0823) SpO2:  [97 %-100 %] 97 % (07/08 0823) Constitutional:  Alert and oriented, no acute distress HEENT: [place] AT, moist mucus membranes Cardiovascular: No clubbing, cyanosis, or edema Respiratory: Normal respiratory effort Skin: No rashes, bruises or suspicious lesions Neurologic: Grossly intact, no focal deficits, moving all 4 extremities Psychiatric: Normal mood and affect  Laboratory Data:  Recent Labs    03/21/20 0143  WBC 8.2  HGB 16.7  HCT 48.2   Recent Labs    03/21/20 0143  NA 137  K 3.8  CL 103  CO2 23  GLUCOSE 107*  BUN 11  CREATININE 1.02  CALCIUM 9.3   Urinalysis    Component Value Date/Time   COLORURINE YELLOW (A) 03/21/2020 0655   APPEARANCEUR CLOUDY (A) 03/21/2020 0655   APPEARANCEUR Clear 12/18/2011 1430   LABSPEC 1.016 03/21/2020 0655   LABSPEC 1.003 12/18/2011 1430   PHURINE 6.0 03/21/2020 0655   GLUCOSEU NEGATIVE 03/21/2020 0655   GLUCOSEU Negative 12/18/2011 1430   HGBUR LARGE (A) 03/21/2020 0655   BILIRUBINUR NEGATIVE 03/21/2020 0655   BILIRUBINUR Negative 12/18/2011 1430   KETONESUR NEGATIVE 03/21/2020 0655   PROTEINUR 30 (A) 03/21/2020   0655   NITRITE NEGATIVE 03/21/2020 0655   LEUKOCYTESUR NEGATIVE 03/21/2020 0655   LEUKOCYTESUR Negative 12/18/2011 1430   Results for orders placed or performed during the hospital encounter of 03/21/20  SARS Coronavirus 2 by RT PCR (hospital order, performed in [facility] hospital lab) Nasopharyngeal Nasopharyngeal Swab     Status: None   Collection Time: 03/21/20  9:06 AM   Specimen: Nasopharyngeal Swab  Result Value Ref Range Status   SARS Coronavirus 2 NEGATIVE NEGATIVE Final    Comment: (NOTE) SARS-CoV-2  target nucleic acids are NOT DETECTED.  The SARS-CoV-2 RNA is generally detectable in upper and lower respiratory specimens during the acute phase of infection. The lowest concentration of SARS-CoV-2 viral copies this assay can detect is 250 copies / mL. A negative result does not preclude SARS-CoV-2 infection and should not be used as the sole basis for treatment or other patient management decisions.  A negative result may occur with improper specimen collection / handling, submission of specimen other than nasopharyngeal swab, presence of viral mutation(s) within the areas targeted by this assay, and inadequate number of viral copies (<250 copies / mL). A negative result must be combined with clinical observations, patient history, and epidemiological information.  Fact Sheet for Patients:   https://www.fda.gov/media/136312/download  Fact Sheet for Healthcare Providers: https://www.fda.gov/media/136313/download  This test is not yet approved or  cleared by the [place] FDA and has been authorized for detection and/or diagnosis of SARS-CoV-2 by FDA under an Emergency Use Authorization (EUA).  This EUA will remain in effect (meaning this test can be used) for the duration of the COVID-19 declaration under Section 564(b)(1) of the Act, 21 U.S.C. section 360bbb-3(b)(1), unless the authorization is terminated or revoked sooner.  Performed at [facility], [address] [place], [place] [address]     Radiologic Imaging: DG Abdomen 1 View  Result Date: 03/21/2020 CLINICAL DATA:  Pt c/o left flank pain with sudden onset while urinating this AM, kidney stone, no hx of kidney stone EXAM: ABDOMEN - 1 VIEW COMPARISON:  CT renal stone 03/21/2020 FINDINGS: The bowel gas pattern is normal. There is a 7 mm radiopaque calculus adjacent to the left L2 transverse process, corresponding to the stone seen on same day CT. No additional calculi visualized. No acute finding in the  visualized skeleton. IMPRESSION: 7 mm radiopaque calculus adjacent to the left L2 transverse process, corresponding to the ureteral stone seen on same day CT. Electronically Signed   By: Nancy  Ballantyne M.D.   On: 03/21/2020 08:26   CT Renal Stone Study  Result Date: 03/21/2020 CLINICAL DATA:  Flank pain with kidney stone suspected EXAM: CT ABDOMEN AND PELVIS WITHOUT CONTRAST TECHNIQUE: Multidetector CT imaging of the abdomen and pelvis was performed following the standard protocol without IV contrast. COMPARISON:  12/18/2011 FINDINGS: Lower chest:  No contributory findings. Hepatobiliary: No focal liver abnormality.No evidence of biliary obstruction or stone. Pancreas: Unremarkable. Spleen: Unremarkable. Adrenals/Urinary Tract: Negative adrenals. 7 x 3 mm stone in the upper left ureter, just beyond-the UPJ, with hydronephrosis and perinephric stranding. Punctate lower pole calculus. Unremarkable bladder. Stomach/Bowel:  No obstruction. No appendicitis. Vascular/Lymphatic: No acute vascular abnormality. No mass or adenopathy. Reproductive:No pathologic findings. Other: No ascites or pneumoperitoneum.  Right inguinal hernia repair Musculoskeletal: No acute abnormalities. IMPRESSION: 1. Mild left hydronephrosis from a 7 x 3 mm proximal ureteric stone. 2. Punctate left nephrolithiasis. Electronically Signed   By: Jonathon  Watts M.D.   On: 03/21/2020 04:28   Assessment & Plan:    39-year-old male presents with renal colic in the setting of an obstructing 7 x 3 mm proximal left ureteral stone without a known history of nephrolithiasis.  Creatinine, WBC count WNL.  UA consistent with acute stone episode.  I had a lengthy conversation with the patient at the bedside today.  I explained that there is an approximate 30% chance of spontaneous passage of the stone with MET.  Alternatively, I offered him ESWL today for management of the stone.  I explained that this procedure requires sedation and that the goal of  treatment will be to fragment the stone into smaller pieces that he will be able to pass on his own.  I explained that he will need to take medication after the procedure to help dilate his urinary passages, stay well-hydrated, and strain his urine to collect stone fragments as they pass.  I explained that the primary risks of the procedure include bleeding and retained stone fragments requiring a secondary procedure.  He expressed understanding and stated that he wishes to proceed with ESWL today.  Of note, patient is uninsured.  [facility] will require him to complete a payment agreement with them in advance of procedure.  I am working with Dr. Robinson and the ED staff to arrange this.  We will plan to add him on for ESWL with Dr. Brandon at the end of the day today.  Please keep him NPO.  No NSAIDs.  Thank you for involving me in this patient's care, I will continue to follow along.  Samantha Vaillancourt, PA-C 03/21/2020 11:14 AM     I interviewed and examined the patient independently of our PA Samantha Vaillancourt. 39 yo M with severe left sided flank pain, CT with 7mm proximal ureteral stone. No clinical or laboratory signs of infection. We discussed options at length including MET, SWL, or URS/LL/stent. Risks, benefits, and alternatives discussed. He opted for SWL today.   I spent 40 total minutes on the day of the encounter including pre-visit review of the medical record, face-to-face time with the patient, and post visit ordering of labs/imaging/tests.   Brian Sninsky, MD 03/21/2020  

## 2020-03-21 NOTE — TOC Initial Note (Signed)
Transition of Care [facility]) - Initial/Assessment Note    Patient Details  Name: Adam Webb MRN: [id number] Date of Birth: 1981-06-23  Transition of Care [facility]) CM/SW Contact:    Sarah Ann Cellar, RN Phone Number: 03/21/2020, 11:34 AM  Clinical Narrative:                 Spoke with patients roommate, Lovie Chol, [phone], who showed writer a text from patient stating he was feeling overwhelmed and was scared to make medical decisions until she was at his bedside. Patient was laying in bed snoring and not involved in conversation. Patient stopped snoring during conversation but did not engage into conversation. Roommate states patient has no income and has not made any money at all this year due to COVID. Patient works at [employer] and has not had work for him. Roommate is trying to complete paperwork for charity care at the bedside however patient has not filed taxes for several years and has none of the requested information. RN CM provided customer service number for financial services for patient to call and get clarification. Roommate states she feels patient has impaired decision making due to pain medication and already having an impairment from a previous electrocution. Confirmed patient is deemed competent and has no guardians.   Expected Discharge Plan: Home/Self Care Barriers to Discharge: Continued Medical Work up   Patient Goals and CMS Choice Patient states their goals for this hospitalization and ongoing recovery are:: Get kidney stone removed      Expected Discharge Plan and Services Expected Discharge Plan: Home/Self Care       Living arrangements for the past 2 months: Single Family Home                                      Prior Living Arrangements/Services Living arrangements for the past 2 months: Single Family Home Lives with:: Parents (lives with disabled mother and roommate) Patient language and need for interpreter reviewed:: Yes Do you feel safe  going back to the place where you live?: Yes      Need for Family Participation in Patient Care: Yes (Comment) Care giver support system in place?: Yes (comment)   Criminal Activity/Legal Involvement Pertinent to Current Situation/Hospitalization: No - Comment as needed  Activities of Daily Living      Permission Sought/Granted Permission sought to share information with : Other (comment) (Financial assistance) Permission granted to share information with : Yes, Verbal Permission Granted  Share Information with NAME: Financial Assistance/ Charity Care           Emotional Assessment Appearance:: Appears older than stated age, Disheveled Attitude/Demeanor/Rapport: Sedated Affect (typically observed): Quiet Orientation: : Oriented to Self, Oriented to Place, Oriented to  Time, Oriented to Situation Alcohol / Substance Use: Never Used Psych Involvement: No (comment)  Admission diagnosis:  Flank Pain There are no problems to display for this patient.  PCP:  Patient, No Pcp Per Pharmacy:   [id number] [place], [place] - [address] AT [facility] [address] [place] [address] [place] [address] Phone: [phone] Fax: [phone]     Social Determinants of Health (SDOH) Interventions    Readmission Risk Interventions No flowsheet data found.

## 2020-03-21 NOTE — ED Notes (Signed)
Pt taken to [facility]

## 2020-03-21 NOTE — ED Notes (Signed)
Urology at bedside.

## 2020-03-22 ENCOUNTER — Encounter: Payer: Self-pay | Admitting: Urology

## 2020-03-26 ENCOUNTER — Ambulatory Visit
Admission: RE | Admit: 2020-03-26 | Discharge: 2020-03-26 | Disposition: A | Discharge status: Home / Self Care | Payer: Self-pay | Source: Ambulatory Visit | Attending: Physician Assistant | Admitting: Physician Assistant

## 2020-03-26 ENCOUNTER — Ambulatory Visit (INDEPENDENT_AMBULATORY_CARE_PROVIDER_SITE_OTHER): Payer: Self-pay | Admitting: Physician Assistant

## 2020-03-26 ENCOUNTER — Other Ambulatory Visit: Payer: Self-pay

## 2020-03-26 ENCOUNTER — Ambulatory Visit
Admission: RE | Admit: 2020-03-26 | Discharge: 2020-03-26 | Disposition: A | Discharge status: Home / Self Care | Payer: Self-pay | Attending: Physician Assistant | Admitting: Physician Assistant

## 2020-03-26 ENCOUNTER — Encounter: Payer: Self-pay | Admitting: Physician Assistant

## 2020-03-26 ENCOUNTER — Telehealth: Payer: Self-pay | Admitting: *Deleted

## 2020-03-26 VITALS — BP 123/87 | HR 98 | Ht 68.0 in | Wt 338.0 lb

## 2020-03-26 DIAGNOSIS — N201 Calculus of ureter: Secondary | ICD-10-CM

## 2020-03-26 LAB — URINALYSIS, COMPLETE
Bilirubin, UA: NEGATIVE
Glucose, UA: NEGATIVE
Leukocytes,UA: NEGATIVE
Nitrite, UA: NEGATIVE
Specific Gravity, UA: 1.025 (ref 1.005–1.030)
Urobilinogen, Ur: 0.2 mg/dL (ref 0.2–1.0)
pH, UA: 5 (ref 5.0–7.5)

## 2020-03-26 LAB — MICROSCOPIC EXAMINATION: Bacteria, UA: NONE SEEN

## 2020-03-26 MED ORDER — ONDANSETRON HCL 4 MG PO TABS: 4.0000 mg | ORAL_TABLET | Freq: Three times a day (TID) | ORAL | 1 refills | Status: AC | PRN

## 2020-03-26 MED ORDER — TAMSULOSIN HCL 0.4 MG PO CAPS: 0.4000 mg | ORAL_CAPSULE | Freq: Every day | ORAL | 0 refills | Status: AC

## 2020-03-26 NOTE — Telephone Encounter (Signed)
Patient called the office with intense flank pain. He has been taking Norco as needed and Flomax with no improvement. Denies any other symptoms. Scheduled appointment to be evaluated today, KUB prior. Voiced understanding.

## 2020-03-26 NOTE — Patient Instructions (Signed)
Continue daily Flomax, drinking fluids, and taking pain medication as needed.  I will send prescriptions for nausea medication and more Flomax to your pharmacy today.  If you develop uncontrollable pain, fever over 101F, chills, or nausea/vomiting that is not controllable with nausea medication, go to the Emergency Department.

## 2020-03-26 NOTE — Progress Notes (Signed)
03/26/2020 1:02 PM   Stefanie Libel 1981-05-22 [id number]  CC: Chief Complaint  Patient presents with  . Nephrolithiasis    HPI: Adam Webb is a 39 y.o. male s/p ESWL 5 days ago with Dr. Richardo Hanks for management of a 7x86mm proximal left ureteral stone who presents today for evaluation of flank pain. Operative note significant for configuration change of the stone with treatment. Notably, he is allergic to NSAIDs.  Today he reports sudden onset of severe 9/10 left flank pain and nausea  that awoke him this morning. He took two Norco 5-325mg  tablets with improvement in his pain, now rated as variable between 0 and 7/10 in severity. He denies fever, chills, and vomiting.   This represents his first acute pain episode since undergoing ESWL last week. He has seen fragments pass and notes clearance of post-procedure gross hematuria within 48 hours of therapy. He continues to take daily Flomax and push fluids as previously instructed.  KUB reveals a stable proximal left ureteral stone.  In-office UA today positive for 1+ ketones, 2+ blood, and trace protein; urine microscopy with 6-10 WBCs/HPF and 3-10 RBCs/HPF.   PMH: No past medical history on file.  Surgical History: Past Surgical History:  Procedure Laterality Date  . EXTRACORPOREAL SHOCK WAVE LITHOTRIPSY Left 03/21/2020   Procedure: EXTRACORPOREAL SHOCK WAVE LITHOTRIPSY (ESWL);  Surgeon: Sondra Come, MD;  Location: [facility] ORS;  Service: Urology;  Laterality: Left;  . HERNIA REPAIR      Home Medications:  Allergies as of 03/26/2020      Reactions   Aspirin Hives   Ibuprofen Anaphylaxis   Meloxicam Hives      Medication List       Accurate as of March 26, 2020  1:02 PM. If you have any questions, ask your nurse or doctor.        acetaminophen 325 MG tablet Commonly known as: TYLENOL Take 650 mg by mouth every 6 (six) hours as needed for mild pain.   albuterol 108 (90 Base) MCG/ACT inhaler Commonly known as: VENTOLIN  HFA Inhale 2 puffs into the lungs every 6 (six) hours as needed for wheezing or shortness of breath.   clindamycin 150 MG capsule Commonly known as: Cleocin Take 1 capsule (150 mg total) by mouth 4 (four) times daily.   cyclobenzaprine 10 MG tablet Commonly known as: FLEXERIL Take 1 tablet (10 mg total) by mouth 3 (three) times daily as needed for muscle spasms.   HYDROcodone-acetaminophen 5-325 MG tablet Commonly known as: NORCO/VICODIN Take 1 tablet by mouth every 4 (four) hours as needed for up to 5 days for moderate pain.   hydrOXYzine 50 MG tablet Commonly known as: ATARAX/VISTARIL Take 1 tablet (50 mg total) by mouth 3 (three) times daily as needed.   ondansetron 4 MG tablet Commonly known as: Zofran Take 1 tablet (4 mg total) by mouth every 8 (eight) hours as needed for up to 14 days. Started by: Carman Ching, PA-C   predniSONE 10 MG tablet Commonly known as: DELTASONE day 2 take 5 tablets, day 3 take 4 tablets, day 4 take 3 tablets, day 5 take  2 tablets and 1 tablet the last day   tamsulosin 0.4 MG Caps capsule Commonly known as: FLOMAX Take 1 capsule (0.4 mg total) by mouth daily after supper.   traMADol 50 MG tablet Commonly known as: Ultram Take 1 tablet (50 mg total) by mouth every 6 (six) hours as needed.       Allergies:  Allergies  Allergen Reactions  . Aspirin Hives  . Ibuprofen Anaphylaxis  . Meloxicam Hives    Family History: No family history on file.  Social History:   reports that he has been smoking. He has never used smokeless tobacco. He reports that he does not drink alcohol. No history on file for drug use.  Physical Exam: BP 123/87 (BP Location: Right Arm, Patient Position: Sitting, Cuff Size: Normal)   Pulse 98   Ht 5\' 8"  (1.727 m)   Wt (!) 338 lb (153.3 kg)   BMI 51.39 kg/m   Constitutional:  Alert and oriented, uncomfortable appearing, nontoxic appearing HEENT: [place], AT Cardiovascular: No clubbing, cyanosis, or  edema Respiratory: Normal respiratory effort, no increased work of breathing Skin: No rashes, bruises or suspicious lesions Neurologic: Grossly intact, no focal deficits, moving all 4 extremities Psychiatric: Normal mood and affect  Laboratory Data: Results for orders placed or performed in visit on 03/26/20  Microscopic Examination   Urine  Result Value Ref Range   WBC, UA 6-10 (A) 0 - 5 /hpf   RBC 3-10 (A) 0 - 2 /hpf   Epithelial Cells (non renal) 0-10 0 - 10 /hpf   Casts Present (A) None seen /lpf   Cast Type Hyaline casts N/A   Mucus, UA Present (A) Not Estab.   Bacteria, UA None seen None seen/Few  Urinalysis, Complete  Result Value Ref Range   Specific Gravity, UA 1.025 1.005 - 1.030   pH, UA 5.0 5.0 - 7.5   Color, UA Orange Yellow   Appearance Ur Cloudy (A) Clear   Leukocytes,UA Negative Negative   Protein,UA Trace (A) Negative/Trace   Glucose, UA Negative Negative   Ketones, UA 1+ (A) Negative   RBC, UA 2+ (A) Negative   Bilirubin, UA Negative Negative   Urobilinogen, Ur 0.2 0.2 - 1.0 mg/dL   Nitrite, UA Negative Negative   Microscopic Examination See below:    Pertinent Imaging: KUB, 03/26/2020: CLINICAL DATA:  Left ureteral calculus  EXAM: ABDOMEN - 1 VIEW  COMPARISON:  03/21/2020.  FINDINGS: 3 x 6 mm calcification overlying the left L2 transverse process unchanged from the prior study. This is in the left ureter based on recent CT. No other renal calculi.  Normal bowel gas pattern.  No skeletal lesion.  IMPRESSION: Calculus proximal left ureter unchanged.   Electronically Signed   By: 05/22/2020 M.D.   On: 03/26/2020 16:02  I personally reviewed the images referenced above and note a stable proximal left ureteral stone.  Assessment & Plan:   1. Left ureteral stone Left flank pain 5 days s/p ESWL. Fragments have passed, pain responsive to narcotics, VSS, UA consistent with acute stone episode.  Counseled patient that he is expected  to pass residual stone fragments following ESWL and that this may be associated with pain. Extending Flomax, prescribing Zofran for nausea. Will defer Toradol given patient's NSAID allergy. Counseled him to continue daily Flomax, push fluids, and take Norco as needed for pain control and to proceed to the ED if pain becomes unresponsive to meds. He expressed understanding. - Urinalysis, Complete - tamsulosin (FLOMAX) 0.4 MG CAPS capsule; Take 1 capsule (0.4 mg total) by mouth daily after supper.  Dispense: 20 capsule; Refill: 0 - ondansetron (ZOFRAN) 4 MG tablet; Take 1 tablet (4 mg total) by mouth every 8 (eight) hours as needed for up to 14 days.  Dispense: 20 tablet; Refill: 1   Return if symptoms worsen or fail to improve.  03/28/2020,  PA-C  [facility] [address], [place], [place] [address] [phone]

## 2020-03-28 ENCOUNTER — Telehealth: Payer: Self-pay

## 2020-03-28 ENCOUNTER — Other Ambulatory Visit: Payer: Self-pay | Admitting: Physician Assistant

## 2020-03-28 MED ORDER — HYDROCODONE-ACETAMINOPHEN 5-325 MG PO TABS: 1.0000 | ORAL_TABLET | Freq: Four times a day (QID) | ORAL | 0 refills | Status: AC | PRN

## 2020-03-28 NOTE — Telephone Encounter (Signed)
Patient called looking for refill on [facility]

## 2020-03-28 NOTE — Telephone Encounter (Signed)
Refilled and sent to [facility]. Please inform pt.

## 2020-03-29 NOTE — Telephone Encounter (Signed)
Lm for pt

## 2020-04-03 ENCOUNTER — Other Ambulatory Visit: Payer: Self-pay

## 2020-04-03 DIAGNOSIS — N201 Calculus of ureter: Secondary | ICD-10-CM

## 2020-04-04 ENCOUNTER — Ambulatory Visit: Payer: Self-pay | Admitting: Physician Assistant

## 2020-04-04 ENCOUNTER — Encounter: Payer: Self-pay | Admitting: Physician Assistant

## 2022-03-06 IMAGING — CR DG ABDOMEN 1V
1 series · 2 of 2 positions shown · non-contrast
Comparison: CT renal stone 03/21/2020

CLINICAL DATA: Pt c/o left flank pain with sudden onset while
urinating this AM, kidney stone, no hx of kidney stone

EXAM:
ABDOMEN - 1 VIEW

[Series 1: dg abd 1 view · 0.14mm/px · 2 of 2 slices shown]
[im 1/2]
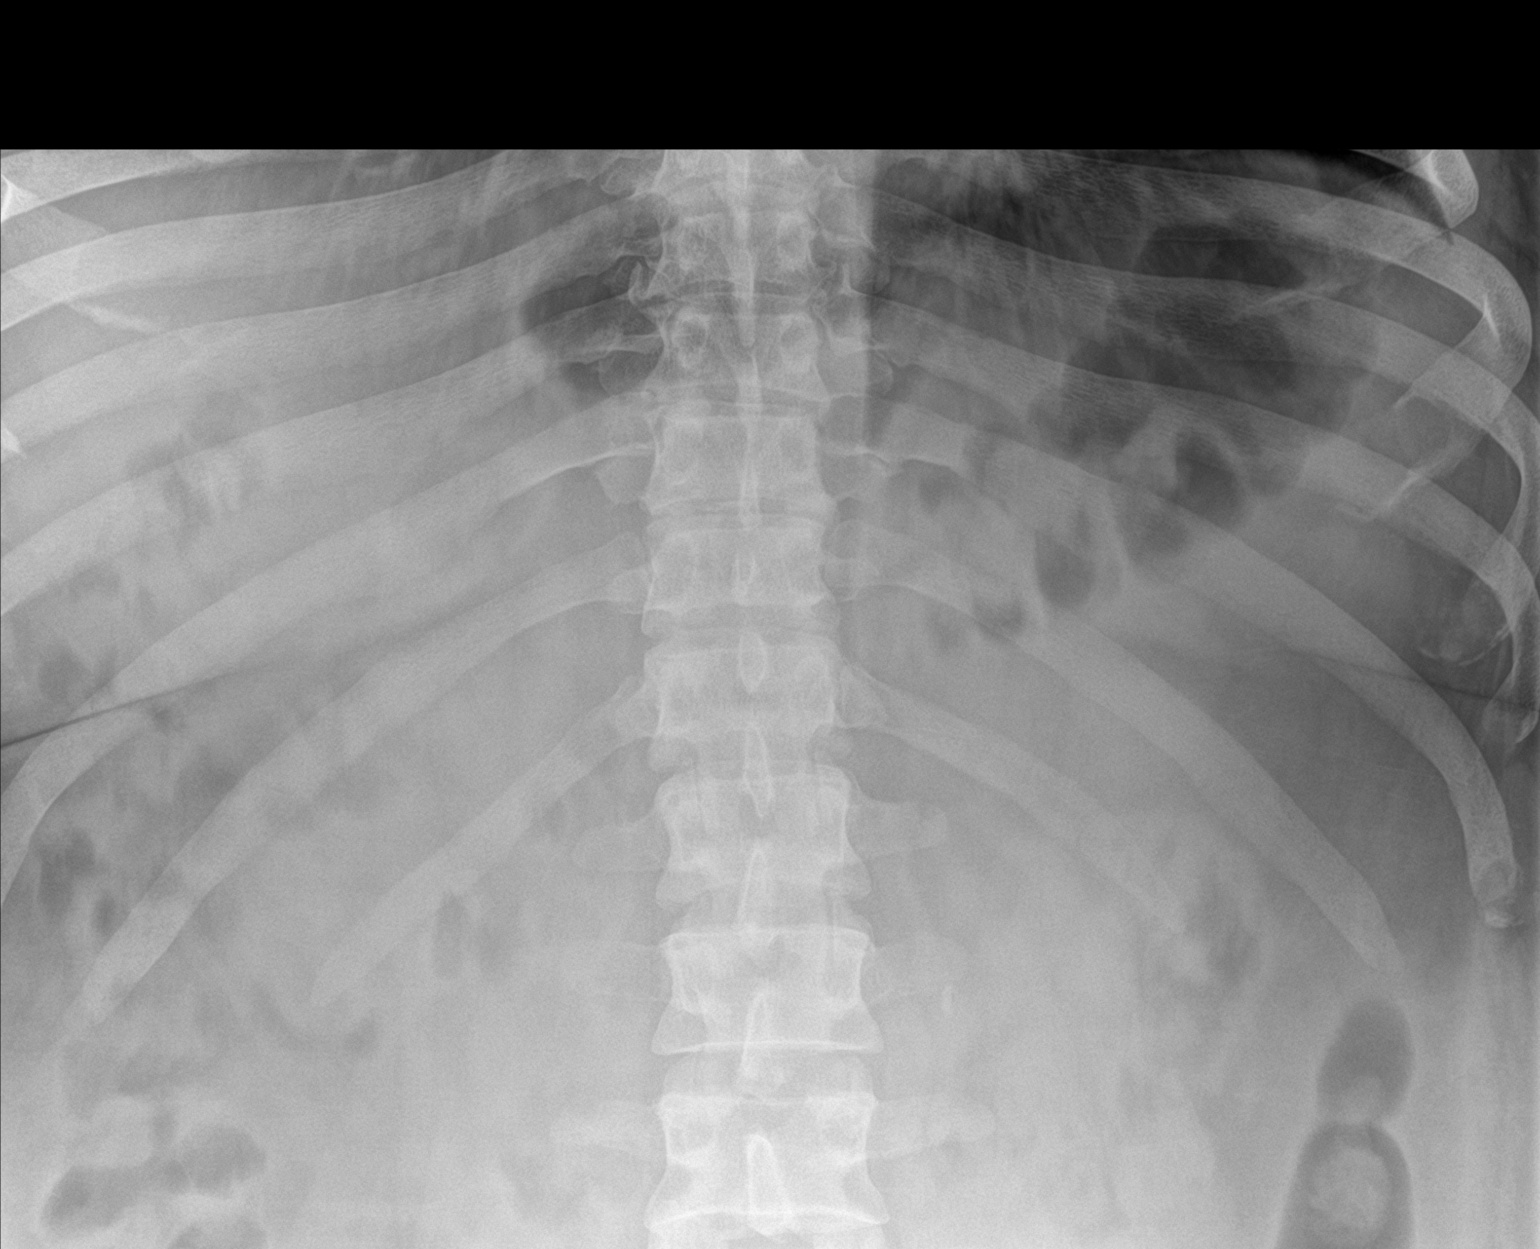
[im 2/2]
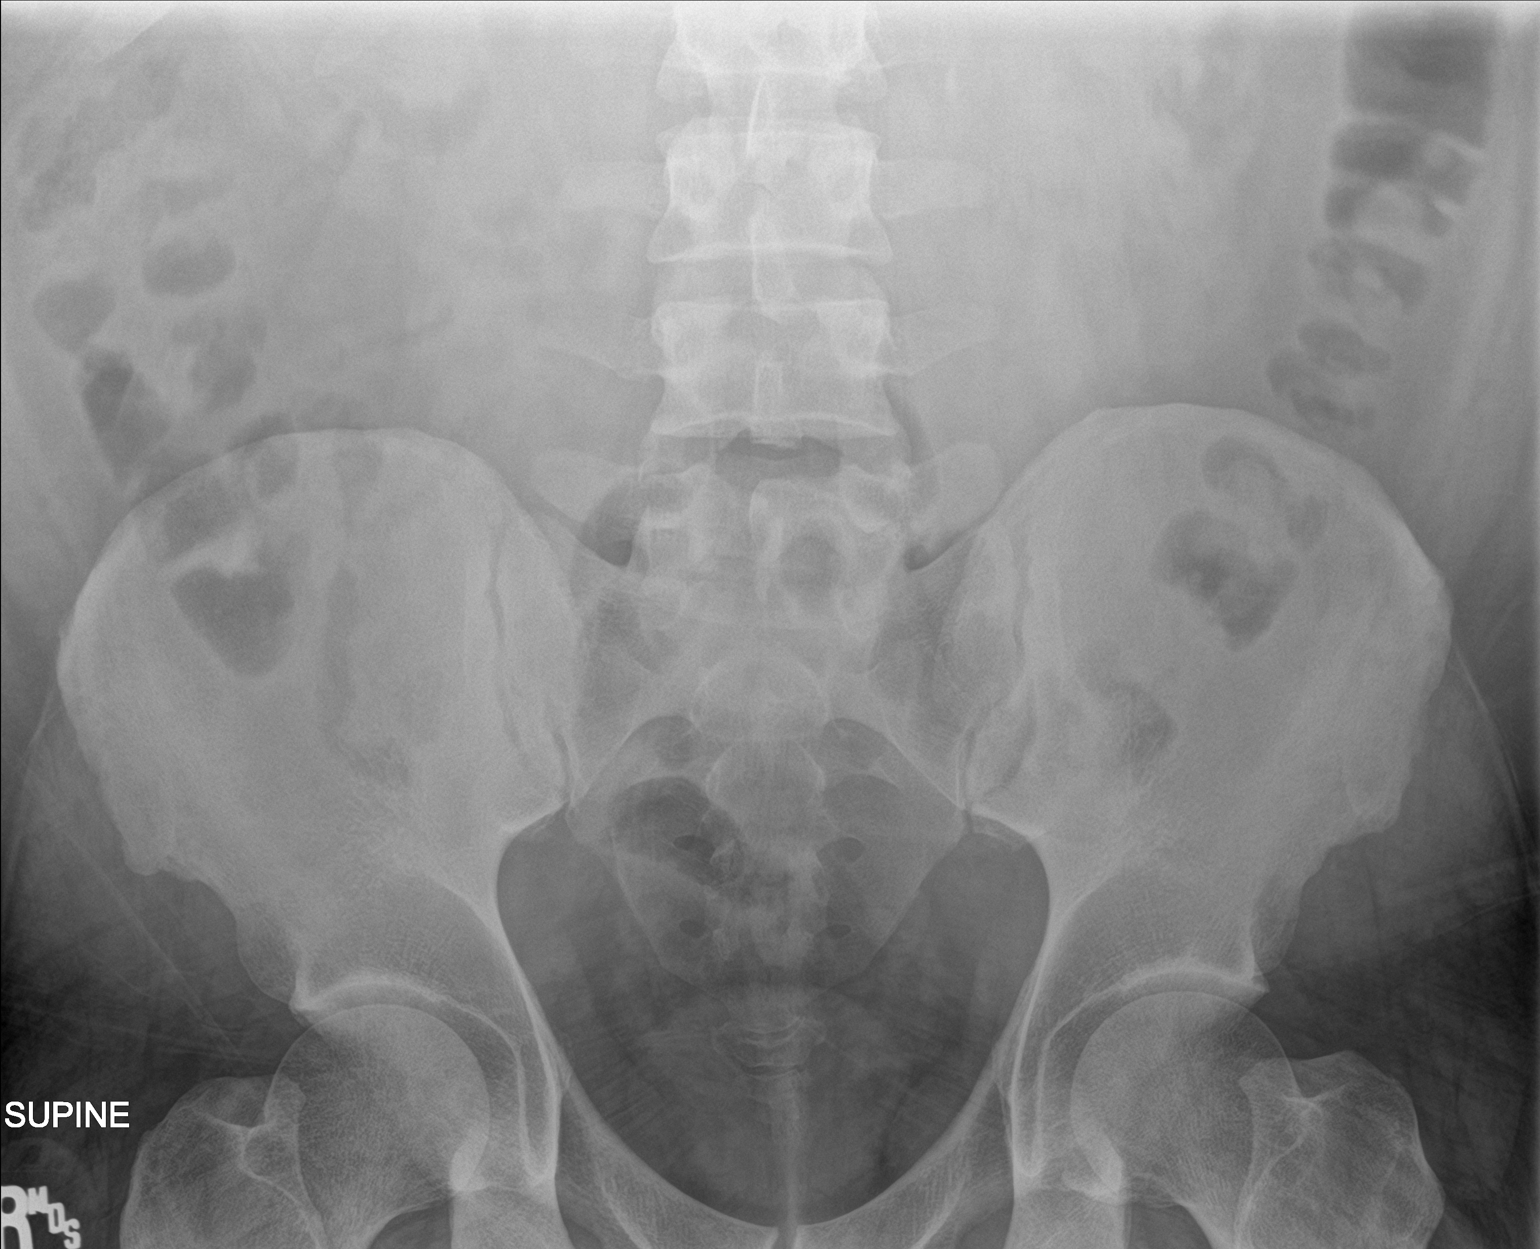

[2 of 2 positions shown; findings below may reference images not displayed]

FINDINGS: The bowel gas pattern is normal. There is a 7 mm radiopaque calculus
adjacent to the left L2 transverse process, corresponding to the
stone seen on same day CT. No additional calculi visualized. No
acute finding in the visualized skeleton.
IMPRESSION: 7 mm radiopaque calculus adjacent to the left L2 transverse process,
corresponding to the ureteral stone seen on same day CT.
